# Patient Record
Sex: Male | Born: 1960 | Race: White | Marital: Married | Smoking: Never smoker
Health system: Southern US, Community
[De-identification: ages and names within clinical notes are randomized; demographics above are authoritative.]

---

## 2018-06-14 DIAGNOSIS — E7800 Pure hypercholesterolemia, unspecified: Secondary | ICD-10-CM | POA: Diagnosis not present

## 2018-06-14 DIAGNOSIS — Z85038 Personal history of other malignant neoplasm of large intestine: Secondary | ICD-10-CM | POA: Diagnosis not present

## 2018-06-14 DIAGNOSIS — Z0000 Encounter for general adult medical examination without abnormal findings: Secondary | ICD-10-CM | POA: Diagnosis not present

## 2018-06-14 DIAGNOSIS — M2550 Pain in unspecified joint: Secondary | ICD-10-CM | POA: Diagnosis not present

## 2018-06-14 DIAGNOSIS — F320 Major depressive disorder, single episode, mild: Secondary | ICD-10-CM | POA: Diagnosis not present

## 2018-06-14 DIAGNOSIS — Z1159 Encounter for screening for other viral diseases: Secondary | ICD-10-CM | POA: Diagnosis not present

## 2018-06-14 DIAGNOSIS — Z136 Encounter for screening for cardiovascular disorders: Secondary | ICD-10-CM | POA: Diagnosis not present

## 2018-06-14 DIAGNOSIS — W57XXXS Bitten or stung by nonvenomous insect and other nonvenomous arthropods, sequela: Secondary | ICD-10-CM | POA: Diagnosis not present

## 2018-07-10 DIAGNOSIS — M7711 Lateral epicondylitis, right elbow: Secondary | ICD-10-CM | POA: Diagnosis not present

## 2018-07-10 DIAGNOSIS — M7712 Lateral epicondylitis, left elbow: Secondary | ICD-10-CM | POA: Diagnosis not present

## 2018-07-10 DIAGNOSIS — M150 Primary generalized (osteo)arthritis: Secondary | ICD-10-CM | POA: Diagnosis not present

## 2018-07-10 DIAGNOSIS — M2550 Pain in unspecified joint: Secondary | ICD-10-CM | POA: Diagnosis not present

## 2018-08-08 DIAGNOSIS — M545 Low back pain: Secondary | ICD-10-CM | POA: Diagnosis not present

## 2018-08-08 DIAGNOSIS — F320 Major depressive disorder, single episode, mild: Secondary | ICD-10-CM | POA: Diagnosis not present

## 2018-08-08 DIAGNOSIS — E7800 Pure hypercholesterolemia, unspecified: Secondary | ICD-10-CM | POA: Diagnosis not present

## 2018-08-08 DIAGNOSIS — F419 Anxiety disorder, unspecified: Secondary | ICD-10-CM | POA: Diagnosis not present

## 2018-08-28 DIAGNOSIS — Z85048 Personal history of other malignant neoplasm of rectum, rectosigmoid junction, and anus: Secondary | ICD-10-CM | POA: Diagnosis not present

## 2018-09-14 DIAGNOSIS — R21 Rash and other nonspecific skin eruption: Secondary | ICD-10-CM | POA: Diagnosis not present

## 2018-09-14 DIAGNOSIS — F419 Anxiety disorder, unspecified: Secondary | ICD-10-CM | POA: Diagnosis not present

## 2018-09-14 DIAGNOSIS — F320 Major depressive disorder, single episode, mild: Secondary | ICD-10-CM | POA: Diagnosis not present

## 2019-01-23 DIAGNOSIS — F419 Anxiety disorder, unspecified: Secondary | ICD-10-CM | POA: Diagnosis not present

## 2019-01-23 DIAGNOSIS — M545 Low back pain: Secondary | ICD-10-CM | POA: Diagnosis not present

## 2019-01-23 DIAGNOSIS — E7800 Pure hypercholesterolemia, unspecified: Secondary | ICD-10-CM | POA: Diagnosis not present

## 2019-01-23 DIAGNOSIS — F320 Major depressive disorder, single episode, mild: Secondary | ICD-10-CM | POA: Diagnosis not present

## 2019-02-06 ENCOUNTER — Ambulatory Visit: Payer: BLUE CROSS/BLUE SHIELD | Admitting: Orthopaedic Surgery

## 2019-02-06 ENCOUNTER — Ambulatory Visit: Payer: Self-pay

## 2019-02-06 ENCOUNTER — Other Ambulatory Visit: Payer: Self-pay

## 2019-02-06 ENCOUNTER — Encounter: Payer: Self-pay | Admitting: Orthopaedic Surgery

## 2019-02-06 DIAGNOSIS — G8929 Other chronic pain: Secondary | ICD-10-CM | POA: Diagnosis not present

## 2019-02-06 DIAGNOSIS — M25562 Pain in left knee: Secondary | ICD-10-CM

## 2019-02-06 MED ORDER — METHYLPREDNISOLONE ACETATE 40 MG/ML IJ SUSP
40.0000 mg | INTRAMUSCULAR | Status: AC | PRN
  Administered 2019-02-06: 40 mg via INTRA_ARTICULAR

## 2019-02-06 MED ORDER — LIDOCAINE HCL 1 % IJ SOLN
3.0000 mL | INTRAMUSCULAR | Status: AC | PRN
  Administered 2019-02-06: 09:00:00 3 mL

## 2019-02-06 NOTE — Progress Notes (Signed)
Office Visit Note   Patient: Jason Ellison           Date of Birth: [birth year]           MRN: [id number] Visit Date: 02/06/2019              Requested by: No referring provider defined for this encounter. PCP: Wilfrid Lund, PA   Assessment & Plan: Visit Diagnoses:  1. Chronic pain of left knee     Plan: We did talk about quad strengthening exercises.  Also recommended a steroid injection in the left knee to try to treat the inflammation that is intra-articular.  I described the rationale behind trying a steroid injection as well as the risk and benefits involved.  He did get a little lightheaded after the injection but tolerated it well.  All question concerns were answered and addressed.  We can see him back in 4 weeks to see how he is doing overall.  Follow-Up Instructions: Return in about 4 weeks (around 03/06/2019).   Orders:  Orders Placed This Encounter  Procedures  . Large Joint Inj  . XR KNEE 3 VIEW LEFT   No orders of the defined types were placed in this encounter.     Procedures: Large Joint Inj: L knee on 02/06/2019 9:07 AM Indications: diagnostic evaluation and pain Details: 22 G 1.5 in needle, superolateral approach  Arthrogram: No  Medications: 3 mL lidocaine 1 %; 40 mg methylPREDNISolone acetate 40 MG/ML Outcome: tolerated well, no immediate complications Procedure, treatment alternatives, risks and benefits explained, specific risks discussed. Consent was given by the patient. Immediately prior to procedure a time out was called to verify the correct patient, procedure, equipment, support staff and site/side marked as required. Patient was prepped and draped in the usual sterile fashion.       Clinical Data: No additional findings.   Subjective: Chief Complaint  Patient presents with  . Left Knee - Pain  The patient comes in with 6 months worth of left knee pain is actually her for several years now but is gotten worse recently.  He was  referred here for further evaluation treatment of this left knee pain.  He does get some cracking and clicking in his knee.  He points the medial joint line is source of his pain.  He does take meloxicam as needed.  He has had no previous surgery on that knee and does not wear a brace.  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  He denies any swelling in the left knee.  HPI  Review of Systems He currently denies any headache, chest pain, shortness of breath, fever, chills, nausea, vomiting  Objective: Vital Signs: There were no vitals taken for this visit.  Physical Exam Is alert and orient x3 and in no acute distress Ortho Exam Examination of his left knee shows full range of motion of the knee.  He has well-maintained medial and lateral joint spaces and some slight patellofemoral crepitation.  There is only just some fairly slight laxity in the Lachman's exam but is just slight.  His McMurray's exam is negative. Specialty Comments:  No specialty comments available.  Imaging: Xr Knee 3 View Left  Result Date: 02/06/2019 3 views of the left knee show no acute findings.  There is no effusion.  There is no evidence of fracture.  There is slight medial joint line tenderness.  There are small osteophytes in the knee suggesting  mild osteoarthritis.    PMFS History: There are no active problems to display for this patient.  History reviewed. No pertinent past medical history.  History reviewed. No pertinent family history.  History reviewed. No pertinent surgical history. Social History   Occupational History  . Not on file  Tobacco Use  . Smoking status: Never Smoker  . Smokeless tobacco: Never Used  Substance and Sexual Activity  . Alcohol use: Not on file  . Drug use: Not on file  . Sexual activity: Not on file

## 2019-05-06 DIAGNOSIS — M791 Myalgia, unspecified site: Secondary | ICD-10-CM | POA: Diagnosis not present

## 2019-05-06 DIAGNOSIS — Z20828 Contact with and (suspected) exposure to other viral communicable diseases: Secondary | ICD-10-CM | POA: Diagnosis not present

## 2019-05-06 DIAGNOSIS — R519 Headache, unspecified: Secondary | ICD-10-CM | POA: Diagnosis not present

## 2019-05-06 DIAGNOSIS — R5383 Other fatigue: Secondary | ICD-10-CM | POA: Diagnosis not present

## 2019-05-06 DIAGNOSIS — Z03818 Encounter for observation for suspected exposure to other biological agents ruled out: Secondary | ICD-10-CM | POA: Diagnosis not present

## 2019-05-07 ENCOUNTER — Other Ambulatory Visit: Payer: Self-pay | Admitting: Radiology

## 2019-05-07 ENCOUNTER — Other Ambulatory Visit: Payer: Self-pay

## 2019-05-07 ENCOUNTER — Encounter: Payer: Self-pay | Admitting: Orthopaedic Surgery

## 2019-05-07 ENCOUNTER — Ambulatory Visit (INDEPENDENT_AMBULATORY_CARE_PROVIDER_SITE_OTHER): Payer: BLUE CROSS/BLUE SHIELD | Admitting: Orthopaedic Surgery

## 2019-05-07 DIAGNOSIS — M25562 Pain in left knee: Secondary | ICD-10-CM

## 2019-05-07 DIAGNOSIS — G8929 Other chronic pain: Secondary | ICD-10-CM

## 2019-05-07 MED ORDER — METHYLPREDNISOLONE ACETATE 40 MG/ML IJ SUSP
40.0000 mg | INTRAMUSCULAR | Status: AC | PRN
  Administered 2019-05-07: 40 mg via INTRA_ARTICULAR

## 2019-05-07 MED ORDER — LIDOCAINE HCL 1 % IJ SOLN
5.0000 mL | INTRAMUSCULAR | Status: AC | PRN
  Administered 2019-05-07: 5 mL

## 2019-05-07 NOTE — Progress Notes (Signed)
Office Visit Note   Patient: Jason Ellison           Date of Birth: Nov 12, 1960           MRN: [id number] Visit Date: 05/07/2019              Requested by: Wilfrid Lund, PA [address] [place],  [place] [address] PCP: Wilfrid Lund, [place]   Assessment & Plan: Visit Diagnoses:  1. Chronic pain of left knee     Plan:  We will send him for an MRI of his left knee to evaluate for meniscal tear given his recurrent pain and only slight narrowing of the medial joint line on radiographs.  Follow-up after the MRI to go over the results and discuss further treatment.   Follow-Up Instructions: Return in about 2 weeks (around 05/21/2019), or After MRI.   Orders:  No orders of the defined types were placed in this encounter.  No orders of the defined types were placed in this encounter.     Procedures: Large Joint Inj: L knee on 05/07/2019 11:33 AM Indications: pain Details: 22 G 1.5 in needle, superolateral approach  Arthrogram: No  Medications: 40 mg methylPREDNISolone acetate 40 MG/ML; 5 mL lidocaine 1 % Aspirate: 0 mL Outcome: tolerated well, no immediate complications Procedure, treatment alternatives, risks and benefits explained, specific risks discussed. Consent was given by the patient. Immediately prior to procedure a time out was called to verify the correct patient, procedure, equipment, support staff and site/side marked as required. Patient was prepped and draped in the usual sterile fashion.       Clinical Data: No additional findings.   Subjective: Chief Complaint  Patient presents with  . Left Knee - Pain    HPI Mr. Dewey returns today for continued to recurrent left knee pain.  States the knee started acting up again about 2 weeks ago.  This was after loading a truck and twisting his knee.  He is having pain medial aspect the knee.  Pain is worse with any twisting motion.  Denies any real mechanical symptoms.  Has been taking some Mobic  which has helped.  Does note some swelling of the knee.  Pain is worse with flexion.  He states the injection on 02/06/2019 gave him good relief the pain from the pain he was having.  Review of Systems Negative for fevers chills shortness of breath chest pain  Objective: Vital Signs: There were no vitals taken for this visit.  Physical Exam Constitutional:      Appearance: He is not ill-appearing or diaphoretic.  Pulmonary:     Effort: Pulmonary effort is normal.  Neurological:     Mental Status: He is alert.  Psychiatric:        Behavior: Behavior normal.     Ortho Exam Left knee full extension full flexion.  Slight patellofemoral crepitus.  Tenderness along medial joint line.  McMurray's is negative.  Definite edema plus minus effusion in the knee.  Calf supple nontender.  No instability valgus varus stressing of the left knee. Specialty Comments:  No specialty comments available.  Imaging: No results found.   PMFS History: There are no problems to display for this patient.  History reviewed. No pertinent past medical history.  History reviewed. No pertinent family history.  History reviewed. No pertinent surgical history. Social History   Occupational History  . Not on file  Tobacco Use  . Smoking status: Never Smoker  .  Smokeless tobacco: Never Used  Substance and Sexual Activity  . Alcohol use: Not on file  . Drug use: Not on file  . Sexual activity: Not on file

## 2019-05-21 ENCOUNTER — Ambulatory Visit: Payer: BC Managed Care – PPO | Admitting: Orthopaedic Surgery

## 2019-06-03 ENCOUNTER — Ambulatory Visit
Admission: RE | Admit: 2019-06-03 | Discharge: 2019-06-03 | Disposition: A | Discharge status: Home / Self Care | Payer: BC Managed Care – PPO | Source: Ambulatory Visit | Attending: Orthopaedic Surgery | Admitting: Orthopaedic Surgery

## 2019-06-03 DIAGNOSIS — M25562 Pain in left knee: Secondary | ICD-10-CM | POA: Diagnosis not present

## 2019-06-03 DIAGNOSIS — G8929 Other chronic pain: Secondary | ICD-10-CM

## 2019-06-05 ENCOUNTER — Encounter: Payer: Self-pay | Admitting: Orthopaedic Surgery

## 2019-06-05 ENCOUNTER — Other Ambulatory Visit: Payer: Self-pay

## 2019-06-05 ENCOUNTER — Ambulatory Visit: Payer: BC Managed Care – PPO | Admitting: Orthopaedic Surgery

## 2019-06-05 DIAGNOSIS — S83242D Other tear of medial meniscus, current injury, left knee, subsequent encounter: Secondary | ICD-10-CM

## 2019-06-05 DIAGNOSIS — M25562 Pain in left knee: Secondary | ICD-10-CM | POA: Diagnosis not present

## 2019-06-05 DIAGNOSIS — G8929 Other chronic pain: Secondary | ICD-10-CM | POA: Diagnosis not present

## 2019-06-05 NOTE — Progress Notes (Signed)
Patient is a very pleasant 59 year old gentleman who comes for follow-up after having a MRI performed of his left knee.  I had seen him on several occasions and it provided steroid injections in his left knee.  He got to where he was having locking catching in his knee and more pain in the injections were not helping.  He is an avid Gaffer.  He still has medial joint line tenderness with his left knee and a positive Murray sign to the medial compartment.  I shared with him the MRI findings and gave him a copy of the report.  He does have a complex mid body to posterior horn medial meniscal tear.  There is also parameniscal cyst.  He does have thinning of the articular cartilage in the medial lateral compartments but no full-thickness cartilage loss.  There is no cartilage changes at the patellofemoral joint.  His lateral meniscus is intact.  The cruciate and collateral ligaments are also intact.  At this point I have recommended arthroscopic intervention for his left knee.  I described in detail what the surgery involves.  I talked about the risks and benefits of surgery and these recommendations.  All questions and concerns were answered and addressed.  He has our surgery scheduler's card.  He is welcome to call for any other questions if he needs some or even obtain a second opinion if needed.  If he does decide to have surgery with [place] we would then see him back at 1 week postoperative.  I did describe the interoperative and postoperative course and what to expect as well.

## 2019-07-08 ENCOUNTER — Telehealth: Payer: Self-pay

## 2019-07-08 NOTE — Telephone Encounter (Signed)
Patient states that he will ice/elevate and if it doesn't get any better he will call back

## 2019-07-08 NOTE — Telephone Encounter (Signed)
It is always hard to diagnose anything over the phone.  If it is swelling and causing enough problems he certainly can try ice and elevation and staying off of it as well as some anti-inflammatories but otherwise we would have to see him in the office.
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
## 2019-07-08 NOTE — Telephone Encounter (Signed)
Please advise 

## 2019-09-25 DIAGNOSIS — Z125 Encounter for screening for malignant neoplasm of prostate: Secondary | ICD-10-CM | POA: Diagnosis not present

## 2019-09-25 DIAGNOSIS — E7800 Pure hypercholesterolemia, unspecified: Secondary | ICD-10-CM | POA: Diagnosis not present

## 2019-09-25 DIAGNOSIS — Z0000 Encounter for general adult medical examination without abnormal findings: Secondary | ICD-10-CM | POA: Diagnosis not present

## 2019-09-25 DIAGNOSIS — F419 Anxiety disorder, unspecified: Secondary | ICD-10-CM | POA: Diagnosis not present

## 2019-09-25 DIAGNOSIS — F320 Major depressive disorder, single episode, mild: Secondary | ICD-10-CM | POA: Diagnosis not present

## 2019-09-25 DIAGNOSIS — M545 Low back pain: Secondary | ICD-10-CM | POA: Diagnosis not present

## 2019-12-04 DIAGNOSIS — E669 Obesity, unspecified: Secondary | ICD-10-CM | POA: Diagnosis not present

## 2019-12-04 DIAGNOSIS — F320 Major depressive disorder, single episode, mild: Secondary | ICD-10-CM | POA: Diagnosis not present

## 2020-01-15 DIAGNOSIS — F320 Major depressive disorder, single episode, mild: Secondary | ICD-10-CM | POA: Diagnosis not present

## 2020-01-15 DIAGNOSIS — E7800 Pure hypercholesterolemia, unspecified: Secondary | ICD-10-CM | POA: Diagnosis not present

## 2020-01-15 DIAGNOSIS — F419 Anxiety disorder, unspecified: Secondary | ICD-10-CM | POA: Diagnosis not present

## 2020-01-15 DIAGNOSIS — M545 Low back pain, unspecified: Secondary | ICD-10-CM | POA: Diagnosis not present

## 2020-01-29 DIAGNOSIS — R748 Abnormal levels of other serum enzymes: Secondary | ICD-10-CM | POA: Diagnosis not present

## 2020-07-31 DIAGNOSIS — F419 Anxiety disorder, unspecified: Secondary | ICD-10-CM | POA: Diagnosis not present

## 2020-07-31 DIAGNOSIS — Z125 Encounter for screening for malignant neoplasm of prostate: Secondary | ICD-10-CM | POA: Diagnosis not present

## 2020-07-31 DIAGNOSIS — Z0000 Encounter for general adult medical examination without abnormal findings: Secondary | ICD-10-CM | POA: Diagnosis not present

## 2020-07-31 DIAGNOSIS — F320 Major depressive disorder, single episode, mild: Secondary | ICD-10-CM | POA: Diagnosis not present

## 2020-07-31 DIAGNOSIS — M545 Low back pain, unspecified: Secondary | ICD-10-CM | POA: Diagnosis not present

## 2020-07-31 DIAGNOSIS — E7800 Pure hypercholesterolemia, unspecified: Secondary | ICD-10-CM | POA: Diagnosis not present

## 2020-08-04 ENCOUNTER — Other Ambulatory Visit: Payer: Self-pay | Admitting: Family Medicine

## 2020-08-04 DIAGNOSIS — R748 Abnormal levels of other serum enzymes: Secondary | ICD-10-CM

## 2020-08-27 ENCOUNTER — Other Ambulatory Visit: Payer: BC Managed Care – PPO

## 2020-08-31 ENCOUNTER — Ambulatory Visit
Admission: RE | Admit: 2020-08-31 | Discharge: 2020-08-31 | Disposition: A | Discharge status: Home / Self Care | Payer: BC Managed Care – PPO | Source: Ambulatory Visit | Attending: Family Medicine | Admitting: Family Medicine

## 2020-08-31 DIAGNOSIS — R945 Abnormal results of liver function studies: Secondary | ICD-10-CM | POA: Diagnosis not present

## 2020-08-31 DIAGNOSIS — R748 Abnormal levels of other serum enzymes: Secondary | ICD-10-CM

## 2020-08-31 DIAGNOSIS — N281 Cyst of kidney, acquired: Secondary | ICD-10-CM | POA: Diagnosis not present

## 2020-09-30 DIAGNOSIS — R5383 Other fatigue: Secondary | ICD-10-CM | POA: Diagnosis not present

## 2020-10-07 DIAGNOSIS — Z125 Encounter for screening for malignant neoplasm of prostate: Secondary | ICD-10-CM | POA: Diagnosis not present

## 2020-10-07 DIAGNOSIS — E291 Testicular hypofunction: Secondary | ICD-10-CM | POA: Diagnosis not present

## 2020-10-07 DIAGNOSIS — R5383 Other fatigue: Secondary | ICD-10-CM | POA: Diagnosis not present

## 2020-10-15 DIAGNOSIS — E291 Testicular hypofunction: Secondary | ICD-10-CM | POA: Diagnosis not present

## 2020-11-26 DIAGNOSIS — R5383 Other fatigue: Secondary | ICD-10-CM | POA: Diagnosis not present

## 2020-11-26 DIAGNOSIS — E291 Testicular hypofunction: Secondary | ICD-10-CM | POA: Diagnosis not present

## 2020-11-26 DIAGNOSIS — Z79890 Hormone replacement therapy (postmenopausal): Secondary | ICD-10-CM | POA: Diagnosis not present

## 2020-12-01 DIAGNOSIS — R5383 Other fatigue: Secondary | ICD-10-CM | POA: Diagnosis not present

## 2020-12-01 DIAGNOSIS — R6882 Decreased libido: Secondary | ICD-10-CM | POA: Diagnosis not present

## 2020-12-01 DIAGNOSIS — E291 Testicular hypofunction: Secondary | ICD-10-CM | POA: Diagnosis not present

## 2020-12-01 DIAGNOSIS — F331 Major depressive disorder, recurrent, moderate: Secondary | ICD-10-CM | POA: Diagnosis not present

## 2021-01-28 DIAGNOSIS — E7800 Pure hypercholesterolemia, unspecified: Secondary | ICD-10-CM | POA: Diagnosis not present

## 2021-01-28 DIAGNOSIS — E669 Obesity, unspecified: Secondary | ICD-10-CM | POA: Diagnosis not present

## 2021-01-28 DIAGNOSIS — F320 Major depressive disorder, single episode, mild: Secondary | ICD-10-CM | POA: Diagnosis not present

## 2021-01-28 DIAGNOSIS — F419 Anxiety disorder, unspecified: Secondary | ICD-10-CM | POA: Diagnosis not present

## 2021-01-29 DIAGNOSIS — Z125 Encounter for screening for malignant neoplasm of prostate: Secondary | ICD-10-CM | POA: Diagnosis not present

## 2021-01-29 DIAGNOSIS — R5383 Other fatigue: Secondary | ICD-10-CM | POA: Diagnosis not present

## 2021-01-29 DIAGNOSIS — E291 Testicular hypofunction: Secondary | ICD-10-CM | POA: Diagnosis not present

## 2021-02-02 DIAGNOSIS — F419 Anxiety disorder, unspecified: Secondary | ICD-10-CM | POA: Diagnosis not present

## 2021-02-02 DIAGNOSIS — Z6830 Body mass index (BMI) 30.0-30.9, adult: Secondary | ICD-10-CM | POA: Diagnosis not present

## 2021-02-02 DIAGNOSIS — E291 Testicular hypofunction: Secondary | ICD-10-CM | POA: Diagnosis not present

## 2021-03-01 DIAGNOSIS — Z79890 Hormone replacement therapy (postmenopausal): Secondary | ICD-10-CM | POA: Diagnosis not present

## 2021-03-01 DIAGNOSIS — R5383 Other fatigue: Secondary | ICD-10-CM | POA: Diagnosis not present

## 2021-03-01 DIAGNOSIS — E291 Testicular hypofunction: Secondary | ICD-10-CM | POA: Diagnosis not present

## 2021-03-03 DIAGNOSIS — R5383 Other fatigue: Secondary | ICD-10-CM | POA: Diagnosis not present

## 2021-03-03 DIAGNOSIS — Z6831 Body mass index (BMI) 31.0-31.9, adult: Secondary | ICD-10-CM | POA: Diagnosis not present

## 2021-03-03 DIAGNOSIS — E291 Testicular hypofunction: Secondary | ICD-10-CM | POA: Diagnosis not present

## 2021-03-03 DIAGNOSIS — F329 Major depressive disorder, single episode, unspecified: Secondary | ICD-10-CM | POA: Diagnosis not present

## 2021-03-09 DIAGNOSIS — F419 Anxiety disorder, unspecified: Secondary | ICD-10-CM | POA: Diagnosis not present

## 2021-03-09 DIAGNOSIS — I10 Essential (primary) hypertension: Secondary | ICD-10-CM | POA: Diagnosis not present

## 2021-03-09 DIAGNOSIS — R079 Chest pain, unspecified: Secondary | ICD-10-CM | POA: Diagnosis not present

## 2021-03-11 ENCOUNTER — Telehealth: Payer: Self-pay | Admitting: Cardiology

## 2021-03-11 NOTE — Telephone Encounter (Signed)
[facility] reaching out to inform that pts records are in proficient.

## 2021-03-12 ENCOUNTER — Ambulatory Visit: Payer: BC Managed Care – PPO | Admitting: Cardiology

## 2021-03-12 ENCOUNTER — Encounter: Payer: Self-pay | Admitting: Cardiology

## 2021-03-12 ENCOUNTER — Other Ambulatory Visit: Payer: Self-pay

## 2021-03-12 VITALS — BP 118/80 | HR 82 | Ht 70.5 in | Wt 220.2 lb

## 2021-03-12 DIAGNOSIS — E7800 Pure hypercholesterolemia, unspecified: Secondary | ICD-10-CM

## 2021-03-12 DIAGNOSIS — R072 Precordial pain: Secondary | ICD-10-CM | POA: Diagnosis not present

## 2021-03-12 DIAGNOSIS — Z01812 Encounter for preprocedural laboratory examination: Secondary | ICD-10-CM

## 2021-03-12 DIAGNOSIS — R079 Chest pain, unspecified: Secondary | ICD-10-CM

## 2021-03-12 DIAGNOSIS — I10 Essential (primary) hypertension: Secondary | ICD-10-CM

## 2021-03-12 LAB — BASIC METABOLIC PANEL
BUN/Creatinine Ratio: 16 (ref 10–24)
BUN: 20 mg/dL (ref 8–27)
CO2: 29 mmol/L (ref 20–29)
Calcium: 9.7 mg/dL (ref 8.6–10.2)
Chloride: 101 mmol/L (ref 96–106)
Creatinine, Ser: 1.26 mg/dL (ref 0.76–1.27)
Glucose: 73 mg/dL (ref 70–99)
Potassium: 4.7 mmol/L (ref 3.5–5.2)
Sodium: 140 mmol/L (ref 134–144)
eGFR: 65 mL/min/{1.73_m2} (ref >59)

## 2021-03-12 MED ORDER — METOPROLOL TARTRATE 100 MG PO TABS: 100.0000 mg | ORAL_TABLET | Freq: Once | ORAL | 0 refills | Status: AC

## 2021-03-12 NOTE — Assessment & Plan Note (Addendum)
We will go ahead and check a coronary CT scan with possible FFR analysis.  If coronary plaque is present, we will change simvastatin 20 mg over to Crestor 20 mg, high intensity dose.  We would also recheck lipid panel 3 months after this change if necessary. Discussed ER precautions.

## 2021-03-12 NOTE — Patient Instructions (Signed)
Medication Instructions:  The current medical regimen is effective;  continue present plan and medications.  *If you need a refill on your cardiac medications before your next appointment, please call your pharmacy*   Lab Work: Please have blood work today (BMP) If you have labs (blood work) drawn today and your tests are completely normal, you will receive your results only by: MyChart Message (if you have MyChart) OR A paper copy in the mail If you have any lab test that is abnormal or we need to change your treatment, we will call you to review the results.  Testing/Procedures:   Your cardiac CT will be scheduled at:   [facility] [address] [place], [place] [address] [phone]  Please arrive at the [facility] main entrance (entrance A) of [facility] 30 minutes prior to test start time. You can use the FREE valet parking offered at the main entrance (encouraged to control the heart rate for the test) Proceed to the [facility] Radiology Department (first floor) to check-in and test prep.  Please follow these instructions carefully (unless otherwise directed):  Hold all erectile dysfunction medications at least 3 days (72 hrs) prior to test.  On the Day of the Test: Drink plenty of water until 1 hour prior to the test. Do not eat any food 4 hours prior to the test. You may take your regular medications prior to the test.  Take metoprolol (Lopressor) two hours prior to test. HOLD Furosemide/Hydrochlorothiazide morning of the test.  After the Test: Drink plenty of water. After receiving IV contrast, you may experience a mild flushed feeling. This is normal. On occasion, you may experience a mild rash up to 24 hours after the test. This is not dangerous. If this occurs, you can take Benadryl 25 mg and increase your fluid intake. If you experience trouble breathing, this can be serious. If it is severe call 911 IMMEDIATELY. If it is mild, please  call our office. If you take any of these medications: Glipizide/Metformin, Avandament, Glucavance, please do not take 48 hours after completing test unless otherwise instructed.  Please allow 2-4 weeks for scheduling of routine cardiac CTs. Some insurance companies require a pre-authorization which may delay scheduling of this test.   For non-scheduling related questions, please contact the cardiac imaging nurse navigator should you have any questions/concerns: Rockwell Alexandria, Cardiac Imaging Nurse Navigator Larey Brick, Cardiac Imaging Nurse Navigator [facility] Direct Office Dial: [phone]   For scheduling needs, including cancellations and rescheduling, please call [place], [phone].  Follow-Up: At [facility], you and your health needs are our priority.  As part of our continuing mission to provide you with exceptional heart care, we have created designated Provider Care Teams.  These Care Teams include your primary Cardiologist (physician) and Advanced Practice Providers (APPs -  Physician Assistants and Nurse Practitioners) who all work together to provide you with the care you need, when you need it.  We recommend signing up for the patient portal called "MyChart".  Sign up information is provided on this After Visit Summary.  MyChart is used to connect with patients for Virtual Visits (Telemedicine).  Patients are able to view lab/test results, encounter notes, upcoming appointments, etc.  Non-urgent messages can be sent to your provider as well.   To learn more about what you can do with MyChart, go to [email or web address].    Your next appointment:   Follow up will be determine based on the  results of the above study.  Thank you for choosing [facility]!!

## 2021-03-12 NOTE — Assessment & Plan Note (Signed)
Currently on simvastatin 20 mg.  LDL 91 HDL 38 triglycerides 112.  Total cholesterol 150.  No myalgias.  Stated above if coronary plaque noted on CT scan, we will proceed with intensifying statin regimen with Crestor 20.
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
## 2021-03-12 NOTE — Telephone Encounter (Signed)
noted 

## 2021-03-12 NOTE — Assessment & Plan Note (Signed)
Agree with lisinopril 10 mg.  Blood pressure today 118/80.  Excellent.  Discussed potential causes of hypertension.  Does take meloxicam, NSAID.  Watch salt.  Exercise.

## 2021-03-26 ENCOUNTER — Telehealth (HOSPITAL_COMMUNITY): Payer: Self-pay | Admitting: *Deleted

## 2021-03-26 NOTE — Telephone Encounter (Signed)
Reaching out to patient to offer assistance regarding upcoming cardiac imaging study; pt verbalizes understanding of appt date/time, parking situation and where to check in, pre-test NPO status and medications ordered, and verified current allergies; name and call back number provided for further questions should they arise  Ripley Bogosian RN Navigator Cardiac Imaging [facility] [phone] office [phone] cell  Patient to take 100mg metoprolol tartrate two hours prior to cardiac CT scan. He is aware to arrive at 4pm for his 4:30pm scan. 

## 2021-03-29 ENCOUNTER — Encounter (HOSPITAL_COMMUNITY): Payer: Self-pay

## 2021-03-29 ENCOUNTER — Other Ambulatory Visit: Payer: Self-pay

## 2021-03-29 ENCOUNTER — Ambulatory Visit (HOSPITAL_COMMUNITY)
Admission: RE | Admit: 2021-03-29 | Discharge: 2021-03-29 | Disposition: A | Discharge status: Home / Self Care | Payer: BC Managed Care – PPO | Source: Ambulatory Visit | Attending: Cardiology | Admitting: Cardiology

## 2021-03-29 DIAGNOSIS — R072 Precordial pain: Secondary | ICD-10-CM | POA: Insufficient documentation

## 2021-03-29 DIAGNOSIS — I10 Essential (primary) hypertension: Secondary | ICD-10-CM | POA: Diagnosis not present

## 2021-03-29 MED ORDER — NITROGLYCERIN 0.4 MG SL SUBL
SUBLINGUAL_TABLET | SUBLINGUAL | Status: AC
  Filled 2021-03-29: qty 2

## 2021-03-29 MED ORDER — NITROGLYCERIN 0.4 MG SL SUBL
0.8000 mg | SUBLINGUAL_TABLET | Freq: Once | SUBLINGUAL | Status: AC
  Administered 2021-03-29: 0.8 mg via SUBLINGUAL

## 2021-03-29 MED ORDER — IOHEXOL 350 MG/ML SOLN
95.0000 mL | Freq: Once | INTRAVENOUS | Status: AC | PRN
  Administered 2021-03-29: 95 mL via INTRAVENOUS

## 2021-04-06 DIAGNOSIS — F429 Obsessive-compulsive disorder, unspecified: Secondary | ICD-10-CM | POA: Diagnosis not present

## 2021-04-06 DIAGNOSIS — Z79891 Long term (current) use of opiate analgesic: Secondary | ICD-10-CM | POA: Diagnosis not present

## 2021-04-06 DIAGNOSIS — F411 Generalized anxiety disorder: Secondary | ICD-10-CM | POA: Diagnosis not present

## 2021-04-07 ENCOUNTER — Encounter: Payer: Self-pay | Admitting: *Deleted

## 2021-04-07 DIAGNOSIS — E669 Obesity, unspecified: Secondary | ICD-10-CM | POA: Diagnosis not present

## 2021-04-07 DIAGNOSIS — I10 Essential (primary) hypertension: Secondary | ICD-10-CM | POA: Diagnosis not present

## 2021-05-03 DIAGNOSIS — Z79890 Hormone replacement therapy (postmenopausal): Secondary | ICD-10-CM | POA: Diagnosis not present

## 2021-05-03 DIAGNOSIS — R5383 Other fatigue: Secondary | ICD-10-CM | POA: Diagnosis not present

## 2021-05-03 DIAGNOSIS — E291 Testicular hypofunction: Secondary | ICD-10-CM | POA: Diagnosis not present

## 2021-05-05 DIAGNOSIS — R6882 Decreased libido: Secondary | ICD-10-CM | POA: Diagnosis not present

## 2021-05-05 DIAGNOSIS — E291 Testicular hypofunction: Secondary | ICD-10-CM | POA: Diagnosis not present

## 2021-05-05 DIAGNOSIS — Z6831 Body mass index (BMI) 31.0-31.9, adult: Secondary | ICD-10-CM | POA: Diagnosis not present

## 2021-05-05 DIAGNOSIS — N529 Male erectile dysfunction, unspecified: Secondary | ICD-10-CM | POA: Diagnosis not present

## 2021-06-04 DIAGNOSIS — I10 Essential (primary) hypertension: Secondary | ICD-10-CM | POA: Diagnosis not present

## 2021-06-04 DIAGNOSIS — E669 Obesity, unspecified: Secondary | ICD-10-CM | POA: Diagnosis not present

## 2021-06-04 DIAGNOSIS — Z7689 Persons encountering health services in other specified circumstances: Secondary | ICD-10-CM | POA: Diagnosis not present

## 2021-07-05 DIAGNOSIS — I10 Essential (primary) hypertension: Secondary | ICD-10-CM | POA: Diagnosis not present

## 2021-07-05 DIAGNOSIS — R634 Abnormal weight loss: Secondary | ICD-10-CM | POA: Diagnosis not present

## 2021-07-05 DIAGNOSIS — Z0183 Encounter for blood typing: Secondary | ICD-10-CM | POA: Diagnosis not present

## 2021-08-02 DIAGNOSIS — Z79890 Hormone replacement therapy (postmenopausal): Secondary | ICD-10-CM | POA: Diagnosis not present

## 2021-08-02 DIAGNOSIS — E291 Testicular hypofunction: Secondary | ICD-10-CM | POA: Diagnosis not present

## 2021-08-02 DIAGNOSIS — R5383 Other fatigue: Secondary | ICD-10-CM | POA: Diagnosis not present

## 2021-08-04 DIAGNOSIS — E291 Testicular hypofunction: Secondary | ICD-10-CM | POA: Diagnosis not present

## 2021-08-04 DIAGNOSIS — F419 Anxiety disorder, unspecified: Secondary | ICD-10-CM | POA: Diagnosis not present

## 2021-08-04 DIAGNOSIS — Z6829 Body mass index (BMI) 29.0-29.9, adult: Secondary | ICD-10-CM | POA: Diagnosis not present

## 2021-08-04 DIAGNOSIS — R5383 Other fatigue: Secondary | ICD-10-CM | POA: Diagnosis not present

## 2021-08-10 IMAGING — US US ABDOMEN COMPLETE
1 series · 14 of 25 positions shown · non-contrast
Comparison: None.

CLINICAL DATA: Elevated LFTs

EXAM:
ABDOMEN ULTRASOUND COMPLETE

[Series 1: us abdomen complete · 0.23mm/px · 14 of 106 slices shown]
[im 1/106]
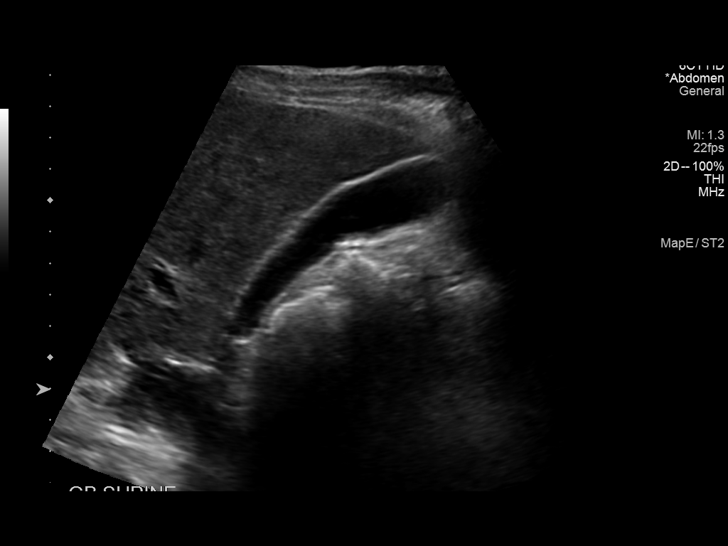
[im 9/106]
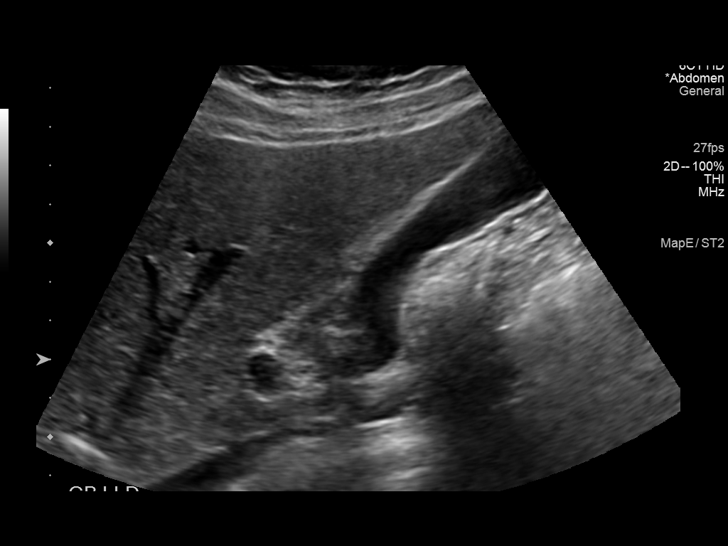
[im 18/106]
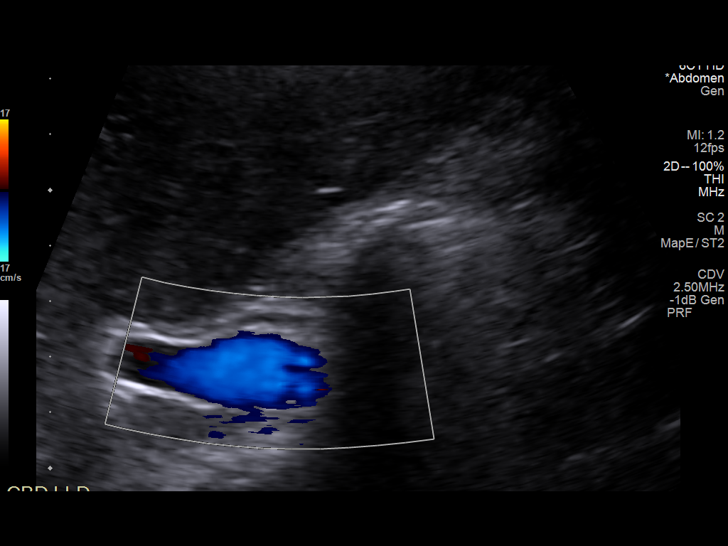
[im 27/106]
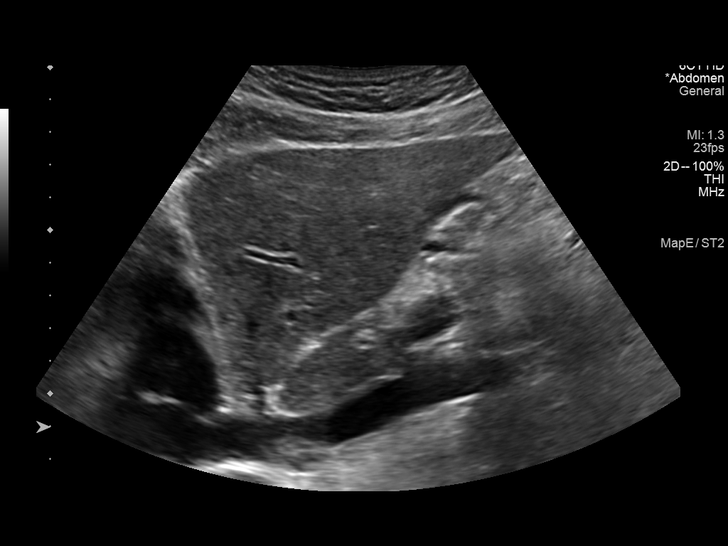
[im 36/106]
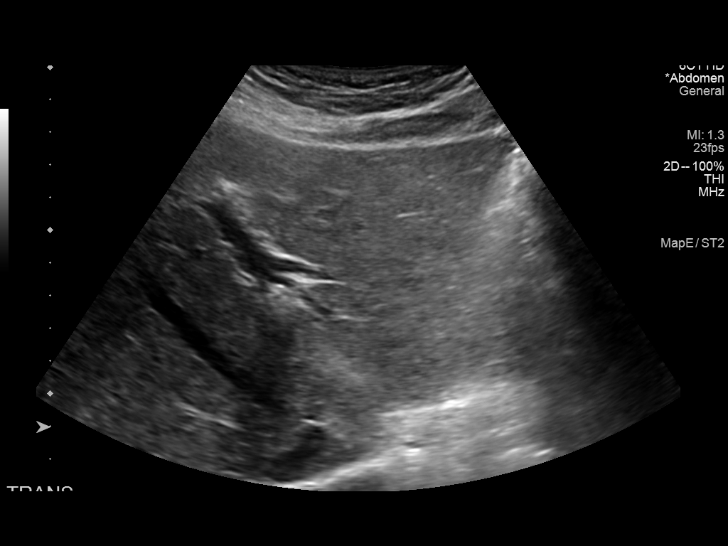
[im 40/106]
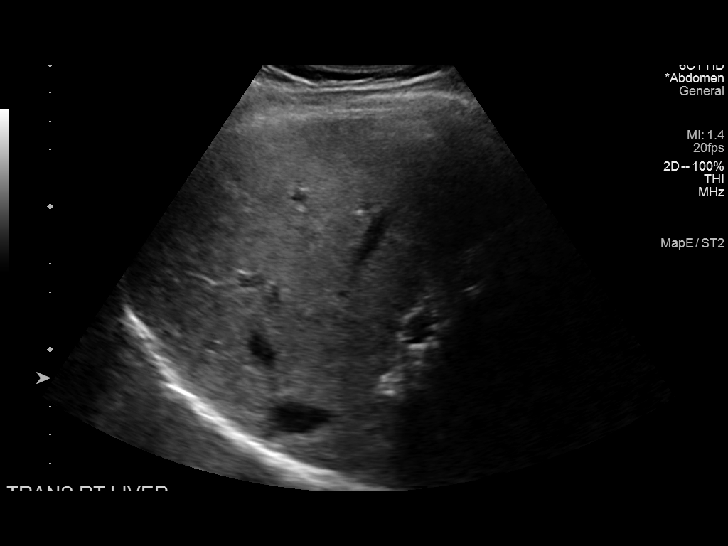
[im 49/106]
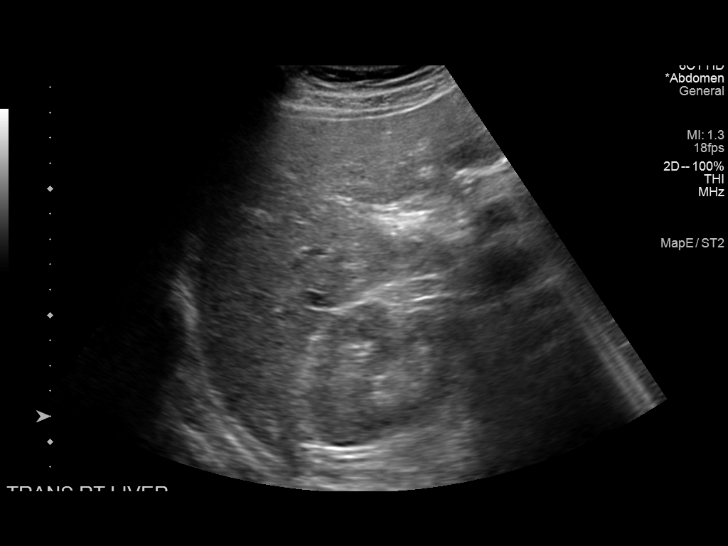
[im 57/106]
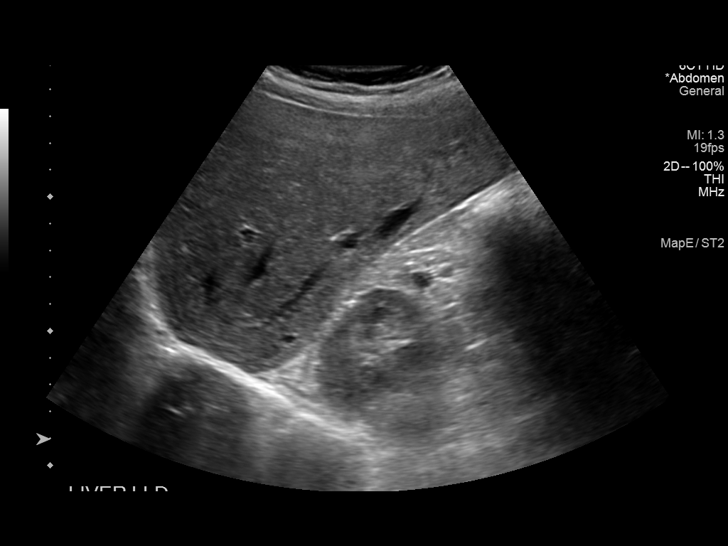
[im 66/106]
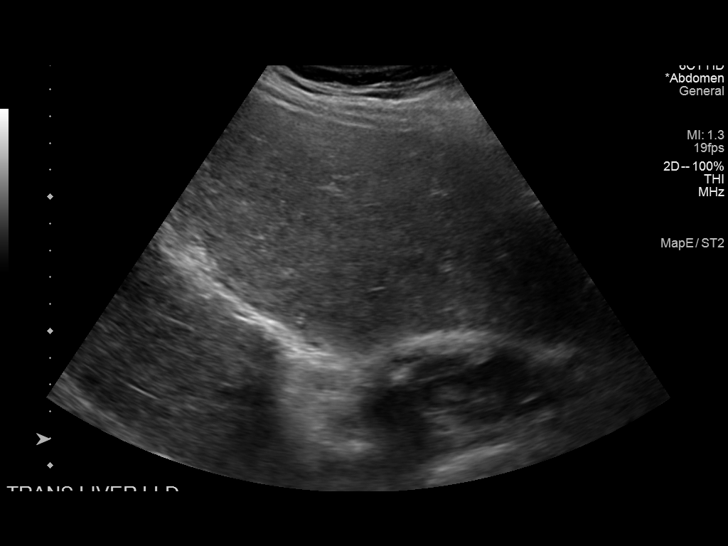
[im 71/106]
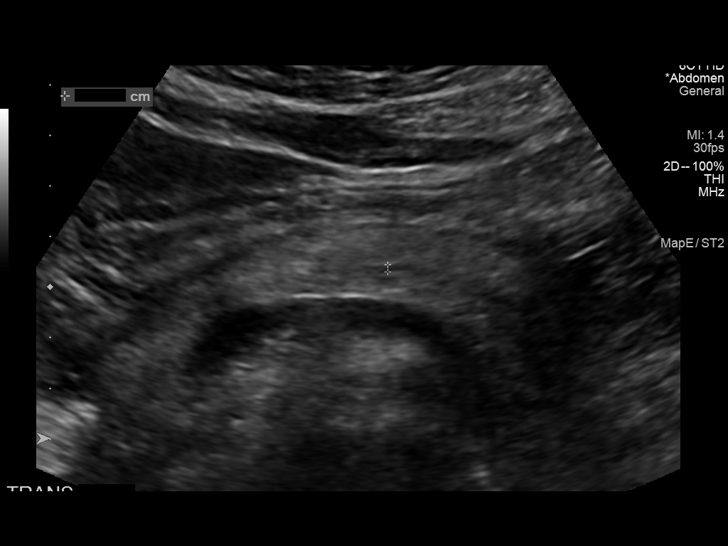
[im 79/106]
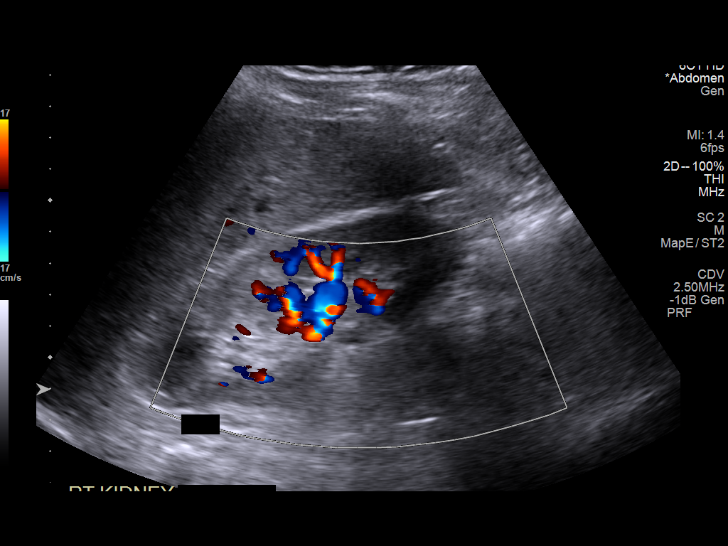
[im 88/106]
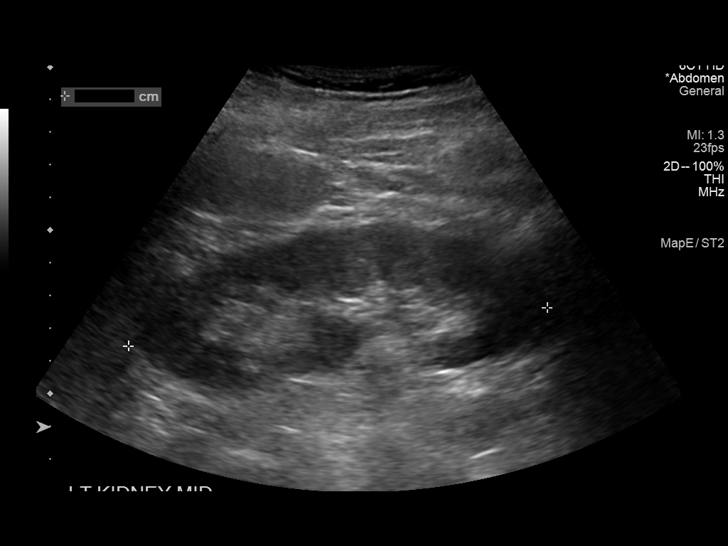
[im 97/106]
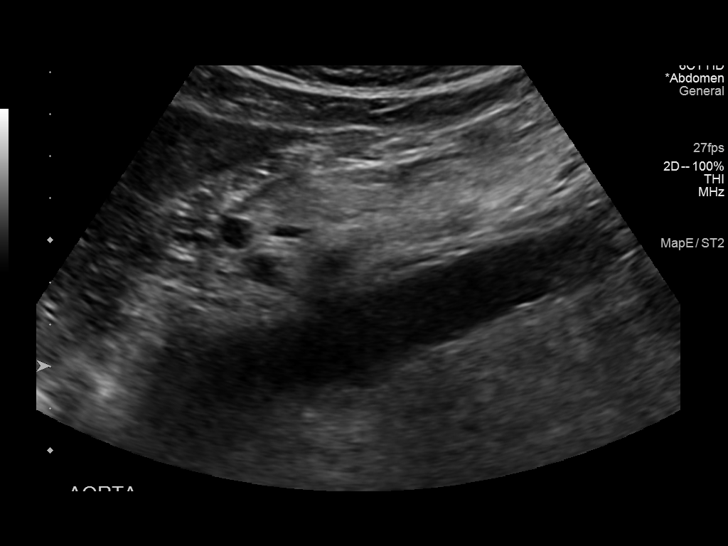
[im 106/106]
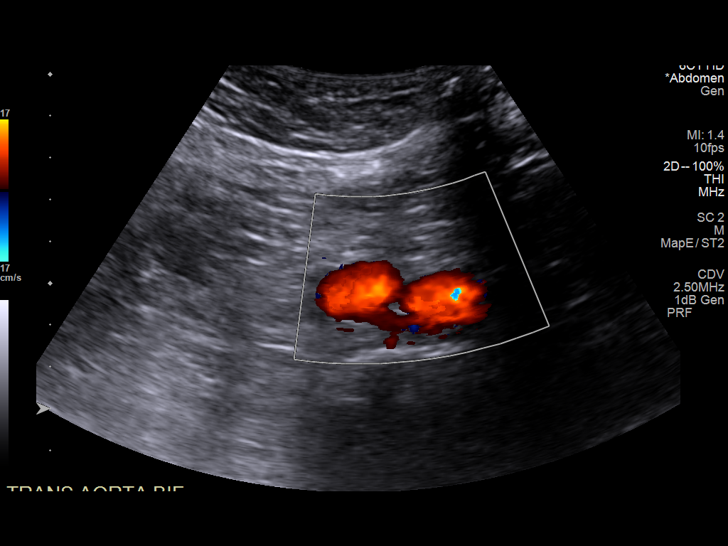

[14 of 25 positions shown; findings below may reference images not displayed]

FINDINGS: Gallbladder: No gallstones or wall thickening visualized. No
sonographic Murphy sign noted by sonographer.

Common bile duct: Diameter: 2 mm

Liver: No focal lesion identified. Mildly increased parenchymal
echogenicity. Portal vein is patent on color Doppler imaging with
normal direction of blood flow towards the liver.

IVC: No abnormality visualized.

Pancreas: Visualized portion unremarkable.

Spleen: Size and appearance within normal limits.

Right Kidney: Length: 12.3 cm. Echogenicity within normal limits.
2.9 cm renal cyst. No solid mass or hydronephrosis visualized.

Left Kidney: Length: 12.8 cm. Echogenicity within normal limits. 2
cm renal cyst no solid mass or hydronephrosis visualized.

Abdominal aorta: No aneurysm visualized.

Other findings: None.
IMPRESSION: 1. Mildly increased parenchymal echogenicity of the liver, which is
nonspecific but can be seen in the setting of fatty infiltration of
the liver. No focal liver lesion.

## 2021-08-20 DIAGNOSIS — I10 Essential (primary) hypertension: Secondary | ICD-10-CM | POA: Diagnosis not present

## 2021-08-20 DIAGNOSIS — F411 Generalized anxiety disorder: Secondary | ICD-10-CM | POA: Diagnosis not present

## 2021-08-20 DIAGNOSIS — E7800 Pure hypercholesterolemia, unspecified: Secondary | ICD-10-CM | POA: Diagnosis not present

## 2021-08-20 DIAGNOSIS — Z0000 Encounter for general adult medical examination without abnormal findings: Secondary | ICD-10-CM | POA: Diagnosis not present

## 2021-08-20 DIAGNOSIS — M545 Low back pain, unspecified: Secondary | ICD-10-CM | POA: Diagnosis not present

## 2021-11-01 DIAGNOSIS — Z79890 Hormone replacement therapy (postmenopausal): Secondary | ICD-10-CM | POA: Diagnosis not present

## 2021-11-01 DIAGNOSIS — R5383 Other fatigue: Secondary | ICD-10-CM | POA: Diagnosis not present

## 2021-11-01 DIAGNOSIS — E291 Testicular hypofunction: Secondary | ICD-10-CM | POA: Diagnosis not present

## 2021-11-03 DIAGNOSIS — M2550 Pain in unspecified joint: Secondary | ICD-10-CM | POA: Diagnosis not present

## 2021-11-03 DIAGNOSIS — R6882 Decreased libido: Secondary | ICD-10-CM | POA: Diagnosis not present

## 2021-11-03 DIAGNOSIS — Z6829 Body mass index (BMI) 29.0-29.9, adult: Secondary | ICD-10-CM | POA: Diagnosis not present

## 2021-11-03 DIAGNOSIS — E291 Testicular hypofunction: Secondary | ICD-10-CM | POA: Diagnosis not present

## 2022-01-31 DIAGNOSIS — R5383 Other fatigue: Secondary | ICD-10-CM | POA: Diagnosis not present

## 2022-01-31 DIAGNOSIS — E291 Testicular hypofunction: Secondary | ICD-10-CM | POA: Diagnosis not present

## 2022-01-31 DIAGNOSIS — Z79890 Hormone replacement therapy (postmenopausal): Secondary | ICD-10-CM | POA: Diagnosis not present

## 2022-02-02 DIAGNOSIS — E291 Testicular hypofunction: Secondary | ICD-10-CM | POA: Diagnosis not present

## 2022-02-02 DIAGNOSIS — N529 Male erectile dysfunction, unspecified: Secondary | ICD-10-CM | POA: Diagnosis not present

## 2022-02-02 DIAGNOSIS — R5383 Other fatigue: Secondary | ICD-10-CM | POA: Diagnosis not present

## 2022-02-02 DIAGNOSIS — R6882 Decreased libido: Secondary | ICD-10-CM | POA: Diagnosis not present

## 2022-02-23 DIAGNOSIS — F411 Generalized anxiety disorder: Secondary | ICD-10-CM | POA: Diagnosis not present

## 2022-02-23 DIAGNOSIS — F320 Major depressive disorder, single episode, mild: Secondary | ICD-10-CM | POA: Diagnosis not present

## 2022-02-23 DIAGNOSIS — E7800 Pure hypercholesterolemia, unspecified: Secondary | ICD-10-CM | POA: Diagnosis not present

## 2022-02-23 DIAGNOSIS — I10 Essential (primary) hypertension: Secondary | ICD-10-CM | POA: Diagnosis not present

## 2022-05-02 DIAGNOSIS — E291 Testicular hypofunction: Secondary | ICD-10-CM | POA: Diagnosis not present

## 2022-05-02 DIAGNOSIS — R5383 Other fatigue: Secondary | ICD-10-CM | POA: Diagnosis not present

## 2022-05-02 DIAGNOSIS — Z79890 Hormone replacement therapy (postmenopausal): Secondary | ICD-10-CM | POA: Diagnosis not present

## 2022-05-04 DIAGNOSIS — R5383 Other fatigue: Secondary | ICD-10-CM | POA: Diagnosis not present

## 2022-05-04 DIAGNOSIS — N529 Male erectile dysfunction, unspecified: Secondary | ICD-10-CM | POA: Diagnosis not present

## 2022-05-04 DIAGNOSIS — E291 Testicular hypofunction: Secondary | ICD-10-CM | POA: Diagnosis not present

## 2022-05-04 DIAGNOSIS — R6882 Decreased libido: Secondary | ICD-10-CM | POA: Diagnosis not present

## 2022-08-01 DIAGNOSIS — E291 Testicular hypofunction: Secondary | ICD-10-CM | POA: Diagnosis not present

## 2022-08-01 DIAGNOSIS — Z79890 Hormone replacement therapy (postmenopausal): Secondary | ICD-10-CM | POA: Diagnosis not present

## 2022-08-01 DIAGNOSIS — R5383 Other fatigue: Secondary | ICD-10-CM | POA: Diagnosis not present

## 2022-08-03 DIAGNOSIS — R5383 Other fatigue: Secondary | ICD-10-CM | POA: Diagnosis not present

## 2022-08-03 DIAGNOSIS — E291 Testicular hypofunction: Secondary | ICD-10-CM | POA: Diagnosis not present

## 2022-08-03 DIAGNOSIS — M2550 Pain in unspecified joint: Secondary | ICD-10-CM | POA: Diagnosis not present

## 2022-08-03 DIAGNOSIS — Z6829 Body mass index (BMI) 29.0-29.9, adult: Secondary | ICD-10-CM | POA: Diagnosis not present

## 2022-09-07 DIAGNOSIS — F320 Major depressive disorder, single episode, mild: Secondary | ICD-10-CM | POA: Diagnosis not present

## 2022-09-07 DIAGNOSIS — F411 Generalized anxiety disorder: Secondary | ICD-10-CM | POA: Diagnosis not present

## 2022-09-07 DIAGNOSIS — I10 Essential (primary) hypertension: Secondary | ICD-10-CM | POA: Diagnosis not present

## 2022-09-07 DIAGNOSIS — E7800 Pure hypercholesterolemia, unspecified: Secondary | ICD-10-CM | POA: Diagnosis not present

## 2022-09-07 DIAGNOSIS — M545 Low back pain, unspecified: Secondary | ICD-10-CM | POA: Diagnosis not present

## 2022-09-07 DIAGNOSIS — Z0000 Encounter for general adult medical examination without abnormal findings: Secondary | ICD-10-CM | POA: Diagnosis not present

## 2022-09-19 DIAGNOSIS — D125 Benign neoplasm of sigmoid colon: Secondary | ICD-10-CM | POA: Diagnosis not present

## 2022-09-19 DIAGNOSIS — Z85038 Personal history of other malignant neoplasm of large intestine: Secondary | ICD-10-CM | POA: Diagnosis not present

## 2022-09-19 DIAGNOSIS — Z08 Encounter for follow-up examination after completed treatment for malignant neoplasm: Secondary | ICD-10-CM | POA: Diagnosis not present

## 2022-09-19 DIAGNOSIS — K5730 Diverticulosis of large intestine without perforation or abscess without bleeding: Secondary | ICD-10-CM | POA: Diagnosis not present

## 2022-10-31 DIAGNOSIS — R5383 Other fatigue: Secondary | ICD-10-CM | POA: Diagnosis not present

## 2022-10-31 DIAGNOSIS — Z79890 Hormone replacement therapy (postmenopausal): Secondary | ICD-10-CM | POA: Diagnosis not present

## 2022-10-31 DIAGNOSIS — E291 Testicular hypofunction: Secondary | ICD-10-CM | POA: Diagnosis not present

## 2022-11-02 DIAGNOSIS — E291 Testicular hypofunction: Secondary | ICD-10-CM | POA: Diagnosis not present

## 2022-11-02 DIAGNOSIS — Z6830 Body mass index (BMI) 30.0-30.9, adult: Secondary | ICD-10-CM | POA: Diagnosis not present

## 2022-11-02 DIAGNOSIS — F419 Anxiety disorder, unspecified: Secondary | ICD-10-CM | POA: Diagnosis not present

## 2022-11-02 DIAGNOSIS — M2550 Pain in unspecified joint: Secondary | ICD-10-CM | POA: Diagnosis not present

## 2022-11-07 DIAGNOSIS — M9903 Segmental and somatic dysfunction of lumbar region: Secondary | ICD-10-CM | POA: Diagnosis not present

## 2022-11-07 DIAGNOSIS — M9905 Segmental and somatic dysfunction of pelvic region: Secondary | ICD-10-CM | POA: Diagnosis not present

## 2022-11-07 DIAGNOSIS — M5137 Other intervertebral disc degeneration, lumbosacral region: Secondary | ICD-10-CM | POA: Diagnosis not present

## 2022-11-07 DIAGNOSIS — M25552 Pain in left hip: Secondary | ICD-10-CM | POA: Diagnosis not present

## 2022-11-08 DIAGNOSIS — M5137 Other intervertebral disc degeneration, lumbosacral region: Secondary | ICD-10-CM | POA: Diagnosis not present

## 2022-11-08 DIAGNOSIS — M25552 Pain in left hip: Secondary | ICD-10-CM | POA: Diagnosis not present

## 2022-11-08 DIAGNOSIS — M9903 Segmental and somatic dysfunction of lumbar region: Secondary | ICD-10-CM | POA: Diagnosis not present

## 2022-11-08 DIAGNOSIS — M9905 Segmental and somatic dysfunction of pelvic region: Secondary | ICD-10-CM | POA: Diagnosis not present

## 2022-11-22 DIAGNOSIS — M5137 Other intervertebral disc degeneration, lumbosacral region: Secondary | ICD-10-CM | POA: Diagnosis not present

## 2022-11-22 DIAGNOSIS — M9905 Segmental and somatic dysfunction of pelvic region: Secondary | ICD-10-CM | POA: Diagnosis not present

## 2022-11-22 DIAGNOSIS — M9903 Segmental and somatic dysfunction of lumbar region: Secondary | ICD-10-CM | POA: Diagnosis not present

## 2022-11-22 DIAGNOSIS — M25552 Pain in left hip: Secondary | ICD-10-CM | POA: Diagnosis not present

## 2022-11-23 DIAGNOSIS — M9903 Segmental and somatic dysfunction of lumbar region: Secondary | ICD-10-CM | POA: Diagnosis not present

## 2022-11-23 DIAGNOSIS — M9905 Segmental and somatic dysfunction of pelvic region: Secondary | ICD-10-CM | POA: Diagnosis not present

## 2022-11-23 DIAGNOSIS — M5137 Other intervertebral disc degeneration, lumbosacral region: Secondary | ICD-10-CM | POA: Diagnosis not present

## 2022-11-23 DIAGNOSIS — M25552 Pain in left hip: Secondary | ICD-10-CM | POA: Diagnosis not present

## 2022-11-24 DIAGNOSIS — M9905 Segmental and somatic dysfunction of pelvic region: Secondary | ICD-10-CM | POA: Diagnosis not present

## 2022-11-24 DIAGNOSIS — M9903 Segmental and somatic dysfunction of lumbar region: Secondary | ICD-10-CM | POA: Diagnosis not present

## 2022-11-24 DIAGNOSIS — M25552 Pain in left hip: Secondary | ICD-10-CM | POA: Diagnosis not present

## 2022-11-24 DIAGNOSIS — M5137 Other intervertebral disc degeneration, lumbosacral region: Secondary | ICD-10-CM | POA: Diagnosis not present

## 2022-11-30 DIAGNOSIS — M9903 Segmental and somatic dysfunction of lumbar region: Secondary | ICD-10-CM | POA: Diagnosis not present

## 2022-11-30 DIAGNOSIS — M5137 Other intervertebral disc degeneration, lumbosacral region: Secondary | ICD-10-CM | POA: Diagnosis not present

## 2022-11-30 DIAGNOSIS — M25552 Pain in left hip: Secondary | ICD-10-CM | POA: Diagnosis not present

## 2022-11-30 DIAGNOSIS — M9905 Segmental and somatic dysfunction of pelvic region: Secondary | ICD-10-CM | POA: Diagnosis not present

## 2023-01-30 DIAGNOSIS — Z79890 Hormone replacement therapy (postmenopausal): Secondary | ICD-10-CM | POA: Diagnosis not present

## 2023-01-30 DIAGNOSIS — E291 Testicular hypofunction: Secondary | ICD-10-CM | POA: Diagnosis not present

## 2023-02-01 DIAGNOSIS — I10 Essential (primary) hypertension: Secondary | ICD-10-CM | POA: Diagnosis not present

## 2023-02-01 DIAGNOSIS — E785 Hyperlipidemia, unspecified: Secondary | ICD-10-CM | POA: Diagnosis not present

## 2023-02-01 DIAGNOSIS — N529 Male erectile dysfunction, unspecified: Secondary | ICD-10-CM | POA: Diagnosis not present

## 2023-02-01 DIAGNOSIS — E291 Testicular hypofunction: Secondary | ICD-10-CM | POA: Diagnosis not present

## 2023-03-09 DIAGNOSIS — K760 Fatty (change of) liver, not elsewhere classified: Secondary | ICD-10-CM | POA: Diagnosis not present

## 2023-03-09 DIAGNOSIS — I10 Essential (primary) hypertension: Secondary | ICD-10-CM | POA: Diagnosis not present

## 2023-03-09 DIAGNOSIS — F320 Major depressive disorder, single episode, mild: Secondary | ICD-10-CM | POA: Diagnosis not present

## 2023-03-09 DIAGNOSIS — E7800 Pure hypercholesterolemia, unspecified: Secondary | ICD-10-CM | POA: Diagnosis not present

## 2023-03-09 DIAGNOSIS — F419 Anxiety disorder, unspecified: Secondary | ICD-10-CM | POA: Diagnosis not present

## 2023-04-11 ENCOUNTER — Ambulatory Visit: Payer: BC Managed Care – PPO | Attending: Family Medicine | Admitting: Audiology

## 2023-04-11 DIAGNOSIS — H903 Sensorineural hearing loss, bilateral: Secondary | ICD-10-CM | POA: Insufficient documentation

## 2023-04-11 NOTE — Procedures (Signed)
  Outpatient Audiology and [facility] [address] [place], [place]  [address] [phone]  AUDIOLOGICAL  EVALUATION  NAME: Jason Ellison     DOB:   [birth year]      MRN: [id number]                                                                                     DATE: 04/11/2023     REFERENT: Wilfrid Lund, PA STATUS: Outpatient DIAGNOSIS: Sensorineural hearing loss, bilateral   History: Jason Ellison was seen for an audiological evaluation due to concerns regarding his hearing sensitivity. Jason Ellison denies concerns regarding his hearing sensitivity however reports his wife has concerns regarding his hearing sensitivity. Jason Ellison reports increased difficulty hearing and communicating in the presence of background noise. He denies otalgia, aural fullness, tinnitus, and dizziness. Jason Ellison has a history of noise exposure from working around [employer].   Evaluation:  Otoscopy showed a clear view of the tympanic membranes, bilaterally Tympanometry results were consistent with normal middle ear function (Type A), bilaterally.  Audiometric testing was completed using Conventional Audiometry techniques with insert earphones and TDH headphones. Test results are consistent with normal hearing sensitivity with the exception of a mild sensorineural hearing loss at 3000-4000 Hz, bilaterally. Speech Recognition Thresholds were obtained at 10 dB HL in the right ear and at 10  dB HL in the left ear. Word Recognition Testing was completed at 60 dB HL and Jason Ellison scored 100% in the right ear and 96% in the left ear.      Results:  The test results were reviewed with Jason Ellison. Test results are consistent with normal hearing sensitivity with the exception of a mild sensorineural hearing loss at 3000-4000 Hz, bilaterally. Jason Ellison may have hearing and communication difficulty in noisy listening environments and he will benefit from the use of good communication strategies. Hearing aids  are not recommended at this time.   Recommendations: 1.   Audiological monitoring is recommended. Return in 3 years for an audiological evaluation.    30 minutes spent testing and counseling on results.   If you have any questions please feel free to contact me at [phone].  Marton Redwood Audiologist, Au.D., CCC-A 04/11/2023  5:04 PM  Cc: Wilfrid Lund, PA
